# Patient Record
Sex: Female | Born: 1975 | Hispanic: Yes | Marital: Single | Smoking: Never smoker
Health system: Southern US, Community
[De-identification: ages and names within clinical notes are randomized; demographics above are authoritative.]

## PROBLEM LIST (undated history)

## (undated) DIAGNOSIS — R519 Headache, unspecified: Secondary | ICD-10-CM

## (undated) DIAGNOSIS — R51 Headache: Secondary | ICD-10-CM

## (undated) DIAGNOSIS — H539 Unspecified visual disturbance: Secondary | ICD-10-CM

## (undated) HISTORY — DX: Unspecified visual disturbance: H53.9

## (undated) HISTORY — DX: Headache, unspecified: R51.9

## (undated) HISTORY — DX: Headache: R51

## (undated) HISTORY — PX: RECONSTRUCTION OF NOSE: SHX2301

---

## 2018-01-18 ENCOUNTER — Encounter: Payer: Self-pay | Admitting: Neurology

## 2018-01-18 ENCOUNTER — Ambulatory Visit (INDEPENDENT_AMBULATORY_CARE_PROVIDER_SITE_OTHER): Payer: 59 | Admitting: Neurology

## 2018-01-18 VITALS — BP 144/79 | HR 75 | Resp 18 | Ht 61.0 in | Wt 251.0 lb

## 2018-01-18 DIAGNOSIS — R519 Headache, unspecified: Secondary | ICD-10-CM | POA: Insufficient documentation

## 2018-01-18 DIAGNOSIS — H4710 Unspecified papilledema: Secondary | ICD-10-CM

## 2018-01-18 DIAGNOSIS — R51 Headache: Secondary | ICD-10-CM

## 2018-01-18 DIAGNOSIS — G4489 Other headache syndrome: Secondary | ICD-10-CM

## 2018-01-18 DIAGNOSIS — R0683 Snoring: Secondary | ICD-10-CM

## 2018-01-18 DIAGNOSIS — G4719 Other hypersomnia: Secondary | ICD-10-CM

## 2018-01-18 MED ORDER — ALPRAZOLAM 0.5 MG PO TABS: ORAL_TABLET | ORAL | 0 refills | Status: AC

## 2018-01-18 MED ORDER — PHENTERMINE HCL 37.5 MG PO CAPS: 37.5000 mg | ORAL_CAPSULE | ORAL | 3 refills | Status: AC

## 2018-01-18 NOTE — Progress Notes (Signed)
[facility]  PATIENT: Dorothy Fisher DOB: [birth year]  REFERRING DOCTOR OR PCP:  Francis Dowse, OD SOURCE: Patient, note from Dr. Joya San, ophthalmology test results, visual field data and OCT personally reviewed.  _________________________________   HISTORICAL  CHIEF COMPLAINT:  Chief Complaint  Patient presents with  . Abnormal Eye Exam    Dorothy Fisher is here to discuss recent abnormal eye exam at [facility].  Sts. was in for her routine exam; no c/o sx., and was told she has pressure behind her eyes. Sts. she has occasional h/a, but not frequent or bad enough to seek tx./fim    HISTORY OF PRESENT ILLNESS:  I had the pleasure seeing patient, Dorothy Fisher, at [facility] for neurologic consultation regarding her papilledema.  She is a 42 year old healthy woman with obesity who ha has had more headaches over the last couple months.  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  I personally reviewed the data from that visit, she was able to be corrected to 20/20.  External eye exam was normal.  Pupils reacted equally.  Slit that was normal.  Funduscopic examination had shown indistinct disc margins bilaterally.  Testing included visual evoked potentials that were within normal range though the P100 had a side-to-side difference of 12 milliseconds, longer on the left.   OCT showed thickened RNFL, bilateral visual fields were normal.  Specifically, the blind spot was not enlarged and peripheral vision appeared to be normal.  She has had more headaches over the last couple months.  She had some migraines when she was younger.   Over the past month, she has had a daily morning tension headache.    She feels some pain on the left more than the right.  Pain is more intense when she wakes up ---- 5/10 today but sometimes 9/10.  Pain almost always improves as the day goes on.  Dorothy Fisher has no diplopia.   She does not  get lightheaded or have graying of vision upon standing.          She denies any issues with balance, strength or sensation.   Weight is stable the past couple years.   She has been noted to snore.  This has not changed much in the last year or 2.  She has mild excessive daytime sleepiness, often dozing off watching TV.      EPWORTH SLEEPINESS SCALE  On a scale of 0 - 3 what is the chance of dozing:  Sitting and Reading:   1 Watching TV:    3 Sitting inactive in a public place: 0 Passenger in car for one hour: 1 Lying down to rest in the afternoon: 3 Sitting and talking to someone: 0 Sitting quietly after lunch:  3 In a car, stopped in traffic:  1  Total (out of 24):   12/24    REVIEW OF SYSTEMS: Constitutional: No fevers, chills, sweats, or change in appetite Eyes: As above Ear, nose and throat: No hearing loss, ear pain, nasal congestion, sore throat Cardiovascular: No chest pain, palpitations Respiratory: No shortness of breath at rest or with exertion.   No wheezes GastrointestinaI: No nausea, vomiting, diarrhea, abdominal pain, fecal incontinence Genitourinary: No dysuria, urinary retention or frequency.  No nocturia. Musculoskeletal: No neck pain, back pain Integumentary: No rash, pruritus, skin lesions Neurological: as above Psychiatric: No depression at this time.  No anxiety Endocrine: No palpitations, diaphoresis, change in appetite, change in weigh or  increased thirst Hematologic/Lymphatic: No anemia, purpura, petechiae. Allergic/Immunologic: No itchy/runny eyes, nasal congestion, recent allergic reactions, rashes  ALLERGIES: No Known Allergies  HOME MEDICATIONS: No current outpatient medications on file.  PAST MEDICAL HISTORY: Past Medical History:  Diagnosis Date  . Headache   . Vision abnormalities     PAST SURGICAL HISTORY:   FAMILY HISTORY: Family History  Problem Relation Age of Onset  . Diabetes type II Mother   . GER disease Father   .  Migraines Sister   . Asthma Sister     SOCIAL HISTORY:  Social History   Socioeconomic History  . Marital status: Single    Spouse name: Not on file  . Number of children: Not on file  . Years of education: Not on file  . Highest education level: Not on file  Occupational History  . Not on file  Social Needs  . Financial resource strain: Not on file  . Food insecurity:    Worry: Not on file    Inability: Not on file  . Transportation needs:    Medical: Not on file    Non-medical: Not on file  Tobacco Use  . Smoking status: Never Smoker  . Smokeless tobacco: Never Used  Substance and Sexual Activity  . Alcohol use: Never    Frequency: Never  . Drug use: Never  . Sexual activity: Not on file  Lifestyle  . Physical activity:    Days per week: Not on file    Minutes per session: Not on file  . Stress: Not on file  Relationships  . Social connections:    Talks on phone: Not on file    Gets together: Not on file    Attends religious service: Not on file    Active member of club or organization: Not on file    Attends meetings of clubs or organizations: Not on file    Relationship status: Not on file  . Intimate partner violence:    Fear of current or ex partner: Not on file    Emotionally abused: Not on file    Physically abused: Not on file    Forced sexual activity: Not on file  Other Topics Concern  . Not on file  Social History Narrative  . Not on file     PHYSICAL EXAM  Vitals:   01/18/18 1018  BP: (!) 144/79  Pulse: 75  Resp: 18  Weight: 251 lb (113.9 kg)  Height: 5' 1"  (1.549 m)    Body mass index is 47.43 kg/m.   General: The patient is well-developed and well-nourished and in no acute distress  Eyes:  Funduscopic exam shows blurred disc margins c/w papilledema.   However, venous pulsations were present.   .  Neck: The neck is supple, no carotid bruits are noted.  The neck is nontender.  Cardiovascular: The heart has a regular rate and  rhythm with a normal S1 and S2. There were no murmurs, gallops or rubs. Lungs are clear to auscultation.  Skin: Extremities are without significant edema.  Musculoskeletal:  Back is nontender  Neurologic Exam  Mental status: The patient is alert and oriented x 3 at the time of the examination. The patient has apparent normal recent and remote memory, with an apparently normal attention span and concentration ability.   Speech is normal.  Cranial nerves: Extraocular movements are full. Pupils are equal, round, and reactive to light and accomodation.  Visual fields are full.  Facial symmetry is present. There is  good facial sensation to soft touch bilaterally.Facial strength is normal.  Trapezius and sternocleidomastoid strength is normal. No dysarthria is noted.  The tongue is midline, and the patient has symmetric elevation of the soft palate. No obvious hearing deficits are noted.  Motor:  Muscle bulk is normal.   Tone is normal. Strength is  5 / 5 in all 4 extremities.   Sensory: Sensory testing is intact to pinprick, soft touch and vibration sensation in all 4 extremities.  Coordination: Cerebellar testing reveals good finger-nose-finger and heel-to-shin bilaterally.  Gait and station: Station is normal.   Gait is normal. Tandem gait is normal. Romberg is negative.   Reflexes: Deep tendon reflexes are symmetric and normal bilaterally.   Plantar responses are flexor.    DIAGNOSTIC DATA (LABS, IMAGING, TESTING) - I reviewed patient records, labs, notes, testing and imaging myself where available.      ASSESSMENT AND PLAN  Papilledema - Plan: Split night study, Thyroid Panel With TSH, Sedimentation rate, ANA w/Reflex  Other headache syndrome - Plan: MR BRAIN W WO CONTRAST, Thyroid Panel With TSH, Sedimentation rate, ANA w/Reflex  Intractable headache, unspecified chronicity pattern, unspecified headache type - Plan: MR ORBITS W WO CONTRAST  Snoring - Plan: Split night  study  Excessive daytime sleepiness - Plan: Split night study  In summary, Dorothy Fisher is a 42 year old woman who has had more headaches over the last couple months he was found to have bilateral papilledema on recent optomet evaluation.  This is persisting on today's exam.  Her exam today is otherwise normal.   We discussed the significance of papilledema.  Statistically, this most likely represents idiopathic intracranial hypertension (pseudotumor cerebri).  However, we need to check an MRI of the brain and orbits to make sure that there is not another process going on such as a tumor, orbital pseudotumor or evidence of other inflammation or ischemia.  She needs to have the MRI on a Saturday to avoid missing more work.  Additionally I will write her for a couple of Xanax tablets as she has some claustrophobia.   Also check TSH, ESR and ANA to determine if there is any evidence of thyroid disorder or vasculitis.  She has snoring and daytime sleepiness and we need to rule out obstructive sleep apnea as it could be associated with a higher likelihood of pseudotumor cerebri.  I discussed with her that if the MRI is essentially normal that we will need to have her proceed with a lumbar puncture to measure the opening pressure.  Further evaluation treatment will be recommended based on the results of her studies.  She will return to see me in 2 months but we would do further evaluation and treatment based on initial results before the next visit.  I want her to have repeat visual field testing later this year.  Thank you for asking me to see Dorothy Fisher for a neurologic consultation.  Please let me know if I can be of further assistance with her or other patients in the future.   Mineola Duan A. Felecia Shelling, MD, [facility] 0/17/5102, 58:52 AM Certified in Neurology, Clinical Neurophysiology, Sleep Medicine, Pain Medicine and Neuroimaging  [facility] [address] [place], [place] [address] [phone]

## 2018-01-19 ENCOUNTER — Telehealth: Payer: Self-pay | Admitting: *Deleted

## 2018-01-19 ENCOUNTER — Telehealth: Payer: Self-pay | Admitting: Neurology

## 2018-01-19 LAB — THYROID PANEL WITH TSH
FREE THYROXINE INDEX: 1.6 (ref 1.2–4.9)
T3 UPTAKE RATIO: 26 % (ref 24–39)
T4 TOTAL: 6.1 ug/dL (ref 4.5–12.0)
TSH: 2.85 u[IU]/mL (ref 0.450–4.500)

## 2018-01-19 LAB — ANA W/REFLEX: Anti Nuclear Antibody(ANA): NEGATIVE

## 2018-01-19 LAB — SEDIMENTATION RATE: Sed Rate: 25 mm/hr (ref 0–32)

## 2018-01-19 NOTE — Telephone Encounter (Signed)
LMOM with below lab results and advised will call with MRI results once it is done.  She does not need to return this call unless she has questions/fim

## 2018-01-19 NOTE — Telephone Encounter (Signed)
[employer] Auth: [id number] (exp. 01/19/18 to 03/05/18) [employer] AutH: [id number] (exp. 01/19/18 to 03/05/18)  Patient is scheduled at GI for 01/28/18.

## 2018-01-19 NOTE — Telephone Encounter (Signed)
-----   Message from Asa Lenteichard A Sater, MD sent at 01/19/2018  3:03 PM EDT ----- Please let her know that the lab work was normal.  We will call you with the results of the MRIs after they are done.

## 2018-01-28 ENCOUNTER — Ambulatory Visit
Admission: RE | Admit: 2018-01-28 | Discharge: 2018-01-28 | Disposition: A | Discharge status: Home / Self Care | Payer: 59 | Source: Ambulatory Visit | Attending: Neurology | Admitting: Neurology

## 2018-01-28 DIAGNOSIS — R51 Headache: Principal | ICD-10-CM

## 2018-01-28 DIAGNOSIS — G4489 Other headache syndrome: Secondary | ICD-10-CM

## 2018-01-28 DIAGNOSIS — H4710 Unspecified papilledema: Secondary | ICD-10-CM | POA: Diagnosis not present

## 2018-01-28 DIAGNOSIS — R519 Headache, unspecified: Secondary | ICD-10-CM

## 2018-01-28 MED ORDER — GADOBENATE DIMEGLUMINE 529 MG/ML IV SOLN
20.0000 mL | Freq: Once | INTRAVENOUS | Status: AC | PRN
  Administered 2018-01-28: 20 mL via INTRAVENOUS

## 2018-02-02 ENCOUNTER — Encounter: Payer: Self-pay | Admitting: *Deleted

## 2018-02-02 ENCOUNTER — Other Ambulatory Visit: Payer: Self-pay | Admitting: *Deleted

## 2018-02-02 ENCOUNTER — Telehealth: Payer: Self-pay | Admitting: *Deleted

## 2018-02-02 DIAGNOSIS — R51 Headache: Principal | ICD-10-CM

## 2018-02-02 DIAGNOSIS — R519 Headache, unspecified: Secondary | ICD-10-CM

## 2018-02-02 DIAGNOSIS — H4710 Unspecified papilledema: Secondary | ICD-10-CM

## 2018-02-02 NOTE — Telephone Encounter (Signed)
-----   Message from Richard A Sater, MD sent at 01/30/2018  2:58 PM EDT ----- Please let her know that the MRI of the brain and orbits show some findings that would be consistent with the pressure being high in the spinal fluid.  We need to proceed with a lumbar puncture (under fluoroscopy) to measure opening pressure.     This could explain the swelling in the back of the eyes as well as a headache.  If the pressure is high on lumbar puncture, I will get her started on the medication. 

## 2018-02-02 NOTE — Telephone Encounter (Signed)
"  The # you have dialed is not in service; please check the # and try again."/fim

## 2018-02-02 NOTE — Telephone Encounter (Signed)
-----   Message from Asa Lenteichard A Sater, MD sent at 01/30/2018  2:58 PM EDT ----- Please let her know that the MRI of the brain and orbits show some findings that would be consistent with the pressure being high in the spinal fluid.  We need to proceed with a lumbar puncture (under fluoroscopy) to measure opening pressure.     This could explain the swelling in the back of the eyes as well as a headache.  If the pressure is high on lumbar puncture, I will get her started on the medication.

## 2018-02-02 NOTE — Progress Notes (Signed)
Spoke with pt. and reviewed MRI results.  She verbalized understanding of same, is agreeable with LP for opening pressure.  Order placed in Epic/fim

## 2018-02-02 NOTE — Telephone Encounter (Signed)
No emergency contact listed/fim

## 2018-02-02 NOTE — Telephone Encounter (Signed)
Nobody listed on her HIPPA.  Unable to contact letter mailed to her home address/fim

## 2018-02-07 ENCOUNTER — Telehealth: Payer: Self-pay

## 2018-02-07 DIAGNOSIS — G4489 Other headache syndrome: Secondary | ICD-10-CM

## 2018-02-07 DIAGNOSIS — G4719 Other hypersomnia: Secondary | ICD-10-CM

## 2018-02-07 DIAGNOSIS — R0683 Snoring: Secondary | ICD-10-CM

## 2018-02-07 NOTE — Telephone Encounter (Signed)
HST order in Epic/fim

## 2018-02-07 NOTE — Addendum Note (Signed)
Addended by: Candis SchatzMISENHEIMER, Jovan Schickling I on: 02/07/2018 04:49 PM   Modules accepted: Orders

## 2018-02-07 NOTE — Telephone Encounter (Signed)
Insurance has denied in lab sleep study order. Need a HST order please.

## 2018-02-20 ENCOUNTER — Telehealth: Payer: Self-pay | Admitting: Neurology

## 2018-02-20 NOTE — Telephone Encounter (Signed)
We have attempted to call the patient 2 times to schedule sleep study. Patient has been unavailable at the phone numbers we have on file and has not returned our calls. At this point we will send a letter asking pt to please contact the sleep lab to schedule their sleep study. If patient calls back we will schedule them for their sleep study. ° °

## 2018-03-03 ENCOUNTER — Telehealth: Payer: Self-pay | Admitting: Neurology

## 2018-03-03 ENCOUNTER — Ambulatory Visit
Admission: RE | Admit: 2018-03-03 | Discharge: 2018-03-03 | Disposition: A | Discharge status: Home / Self Care | Payer: 59 | Source: Ambulatory Visit | Attending: Neurology | Admitting: Neurology

## 2018-03-03 DIAGNOSIS — R51 Headache: Principal | ICD-10-CM

## 2018-03-03 DIAGNOSIS — H4710 Unspecified papilledema: Secondary | ICD-10-CM

## 2018-03-03 DIAGNOSIS — R519 Headache, unspecified: Secondary | ICD-10-CM

## 2018-03-03 MED ORDER — ACETAZOLAMIDE ER 500 MG PO CP12: 500.0000 mg | ORAL_CAPSULE | Freq: Two times a day (BID) | ORAL | 11 refills | Status: AC

## 2018-03-03 NOTE — Telephone Encounter (Signed)
Local pressure was elevated when she had her lumbar puncture earlier today.  It was 34 cm and it was drained to 18 cm.  She tolerated her procedure well.  I spoke to Ms. Dorothy Fisher to go over the results with her.  I will call in Diamox 500 mg p.o. twice daily.

## 2018-03-03 NOTE — Discharge Instructions (Signed)

## 2018-03-06 NOTE — Telephone Encounter (Addendum)
Pts requesting a call to discuss the medication Diamox, stating once she takes the medication she has a slight headache. Pt wondering if the medication is making her lose too much fluid. Please call to advise.

## 2018-03-06 NOTE — Telephone Encounter (Signed)
Spoke with Dorothy DennisonHeather. She sts. Acetazolamide is over $200/mo. with insurance. I checked GoodRx.com and she should be able to get the 125mg  tablets (and take 2 bid, instead of 500mg  bid) for around $86 per month. I emailed the goodrx.com coupon to her. She will check with the pharmacy for the cash price for the 500mg  tablet. (for some reason, cash price for the 500mg  tab isn't on goodrx.com) and if it is cheaper, would like to continue this.  Sts. she does get h/a sometimes after taking Diamox, but it is tolerable.  I have explained this is a listed side effect.  She is due for f/u with [facility] this month, but her appt. isn't until October. I have offered an appt. this wk (8/14) at either 1130 or 1530. She will check her work schedule to see if she is able to take one of these appt's, and call me back tomorrow/fim

## 2018-05-10 ENCOUNTER — Ambulatory Visit: Payer: 59 | Admitting: Neurology

## 2019-10-28 ENCOUNTER — Other Ambulatory Visit: Payer: Self-pay | Admitting: Neurology

## 2020-02-10 IMAGING — XA DG FLUORO GUIDE NDL PLC/BX
2 series · 2 of 2 positions shown · non-contrast
Comparison: none

CLINICAL DATA: Pseudotumor cerebri. No symptoms of headache or
blurred vision. Morbid obesity.

[Series 1: ortho standard · 1 of 1 slices shown (1 of 2)]
[im 1/1]
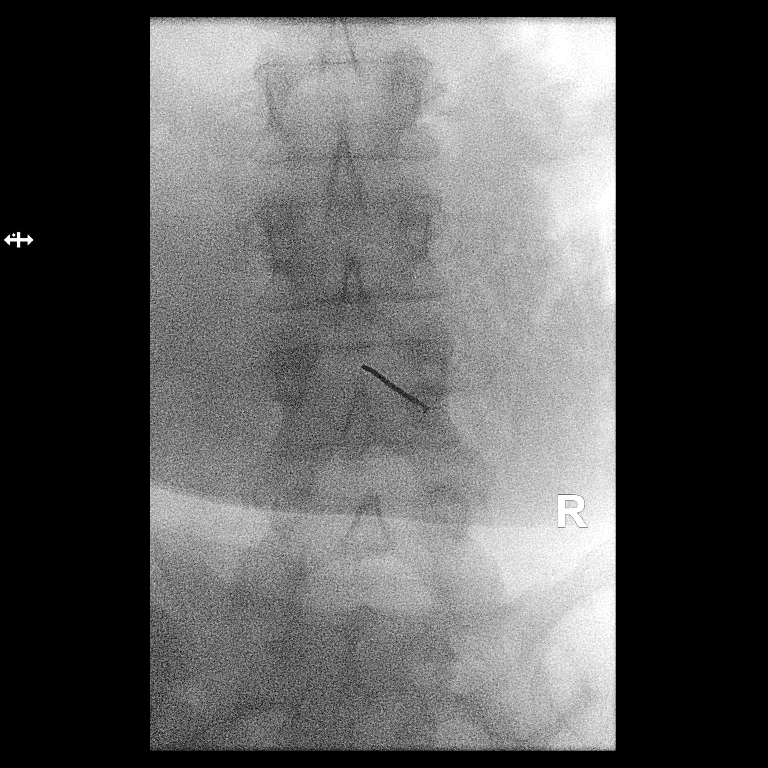

[Series 3: ortho standard · 1 of 1 slices shown (2 of 2)]
[im 1/1]
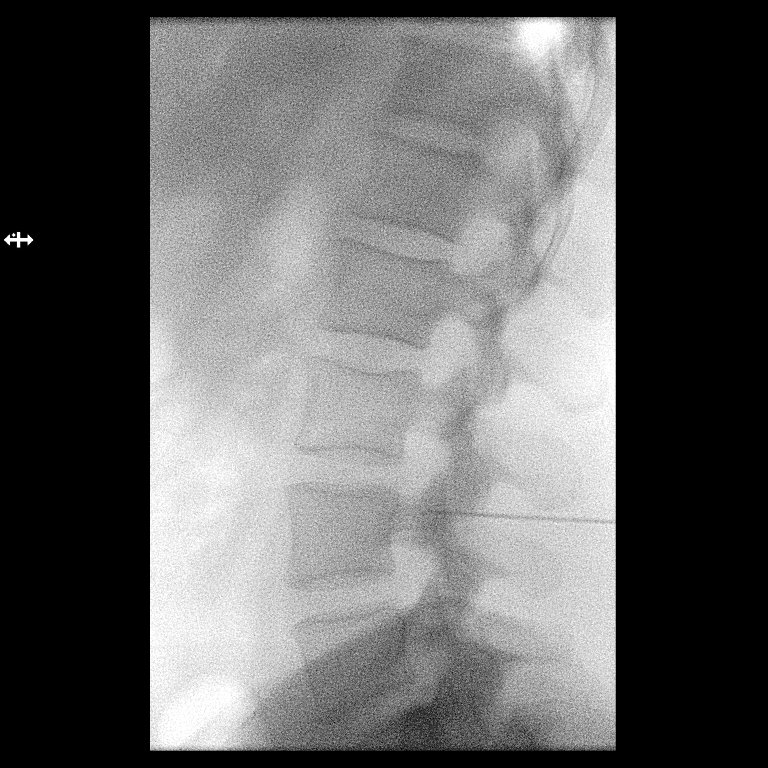

[2 of 2 positions shown; findings below may reference images not displayed]

EXAM:
DIAGNOSTIC LUMBAR PUNCTURE UNDER FLUOROSCOPIC GUIDANCE

FLUOROSCOPY TIME:  43 seconds corresponding to a Dose Area Product
of 149.4 Gy*m2

PROCEDURE:
Informed consent was obtained from the patient prior to the
procedure, including potential complications of headache, allergy,
and pain. With the patient prone, the lower back was prepped with
Betadine. 1% Lidocaine was used for local anesthesia. Lumbar
puncture was performed at the L3-L4 level using a 20 gauge needle
with return of clear CSF with an opening pressure of 34 cm water.
20.0 ml of CSF were obtained for laboratory studies. Closing
pressure was 18 cm water the patient tolerated the procedure well
and there were no apparent complications. Instructions were given
for bed rest for the next 24 hours.
IMPRESSION: Technically successful diagnostic lumbar puncture. Elevated opening
pressure of 34 cm of water consistent with the clinical impression
of idiopathic intracranial hypertension. Pressure was reduced
approximately 50% by removal of 20 mL of CSF.
